# Patient Record
Sex: Male | Born: 1935 | Race: White | Hispanic: No | Marital: Married | Smoking: Never smoker
Health system: Southern US, Community
[De-identification: ages and names within clinical notes are randomized; demographics above are authoritative.]

## PROBLEM LIST (undated history)

## (undated) DIAGNOSIS — C801 Malignant (primary) neoplasm, unspecified: Secondary | ICD-10-CM

## (undated) DIAGNOSIS — R42 Dizziness and giddiness: Secondary | ICD-10-CM

## (undated) DIAGNOSIS — I10 Essential (primary) hypertension: Secondary | ICD-10-CM

## (undated) DIAGNOSIS — R609 Edema, unspecified: Secondary | ICD-10-CM

## (undated) DIAGNOSIS — K219 Gastro-esophageal reflux disease without esophagitis: Secondary | ICD-10-CM

## (undated) DIAGNOSIS — I82409 Acute embolism and thrombosis of unspecified deep veins of unspecified lower extremity: Secondary | ICD-10-CM

## (undated) HISTORY — PX: COLONOSCOPY: SHX174

## (undated) HISTORY — PX: OTHER SURGICAL HISTORY: SHX169

## (undated) HISTORY — PX: FRACTURE SURGERY: SHX138

## (undated) HISTORY — PX: BACK SURGERY: SHX140

---

## 1998-08-28 ENCOUNTER — Ambulatory Visit (HOSPITAL_COMMUNITY): Admission: RE | Admit: 1998-08-28 | Discharge: 1998-08-28 | Payer: Self-pay

## 2004-08-26 ENCOUNTER — Encounter: Admission: RE | Admit: 2004-08-26 | Discharge: 2004-08-26 | Payer: Self-pay | Admitting: Family Medicine

## 2004-09-02 ENCOUNTER — Encounter: Admission: RE | Admit: 2004-09-02 | Discharge: 2004-09-02 | Payer: Self-pay | Admitting: Family Medicine

## 2004-09-11 ENCOUNTER — Encounter: Admission: RE | Admit: 2004-09-11 | Discharge: 2004-09-11 | Payer: Self-pay | Admitting: Family Medicine

## 2007-08-01 ENCOUNTER — Encounter: Admission: RE | Admit: 2007-08-01 | Discharge: 2007-08-01 | Payer: Self-pay | Admitting: Neurosurgery

## 2007-08-10 ENCOUNTER — Inpatient Hospital Stay (HOSPITAL_COMMUNITY): Admission: RE | Admit: 2007-08-10 | Discharge: 2007-08-15 | Payer: Self-pay | Admitting: Neurosurgery

## 2007-09-07 ENCOUNTER — Encounter: Admission: RE | Admit: 2007-09-07 | Discharge: 2007-09-07 | Payer: Self-pay | Admitting: Neurosurgery

## 2007-09-23 ENCOUNTER — Emergency Department (HOSPITAL_COMMUNITY): Admission: EM | Admit: 2007-09-23 | Discharge: 2007-09-23 | Payer: Self-pay | Admitting: Emergency Medicine

## 2007-09-23 ENCOUNTER — Encounter: Admission: RE | Admit: 2007-09-23 | Discharge: 2007-09-23 | Payer: Self-pay | Admitting: Neurosurgery

## 2007-11-04 ENCOUNTER — Encounter: Admission: RE | Admit: 2007-11-04 | Discharge: 2007-11-04 | Payer: Self-pay | Admitting: Neurosurgery

## 2008-04-19 ENCOUNTER — Encounter: Admission: RE | Admit: 2008-04-19 | Discharge: 2008-04-19 | Payer: Self-pay | Admitting: Family Medicine

## 2008-05-11 ENCOUNTER — Encounter: Admission: RE | Admit: 2008-05-11 | Discharge: 2008-05-11 | Payer: Self-pay | Admitting: Neurosurgery

## 2008-05-23 ENCOUNTER — Inpatient Hospital Stay (HOSPITAL_COMMUNITY): Admission: RE | Admit: 2008-05-23 | Discharge: 2008-05-26 | Payer: Self-pay | Admitting: Neurosurgery

## 2008-06-20 ENCOUNTER — Encounter: Admission: RE | Admit: 2008-06-20 | Discharge: 2008-06-20 | Payer: Self-pay | Admitting: Neurosurgery

## 2008-08-24 ENCOUNTER — Encounter: Admission: RE | Admit: 2008-08-24 | Discharge: 2008-08-24 | Payer: Self-pay | Admitting: Neurosurgery

## 2008-10-08 IMAGING — CR DG LUMBAR SPINE 2-3V
3 series · 3 of 3 positions shown · non-contrast
Comparison: [REDACTED] lumbar spine radiographs 11/04/2007, lumbar spine
CT 05/11/2008, [REDACTED] intraoperative lumbar spine radiographs
05/23/2008.

CLINICAL DATA: PLIF.  Low back pain.

LUMBAR SPINE - 2-3 VIEW

[t l-spine a.p.]
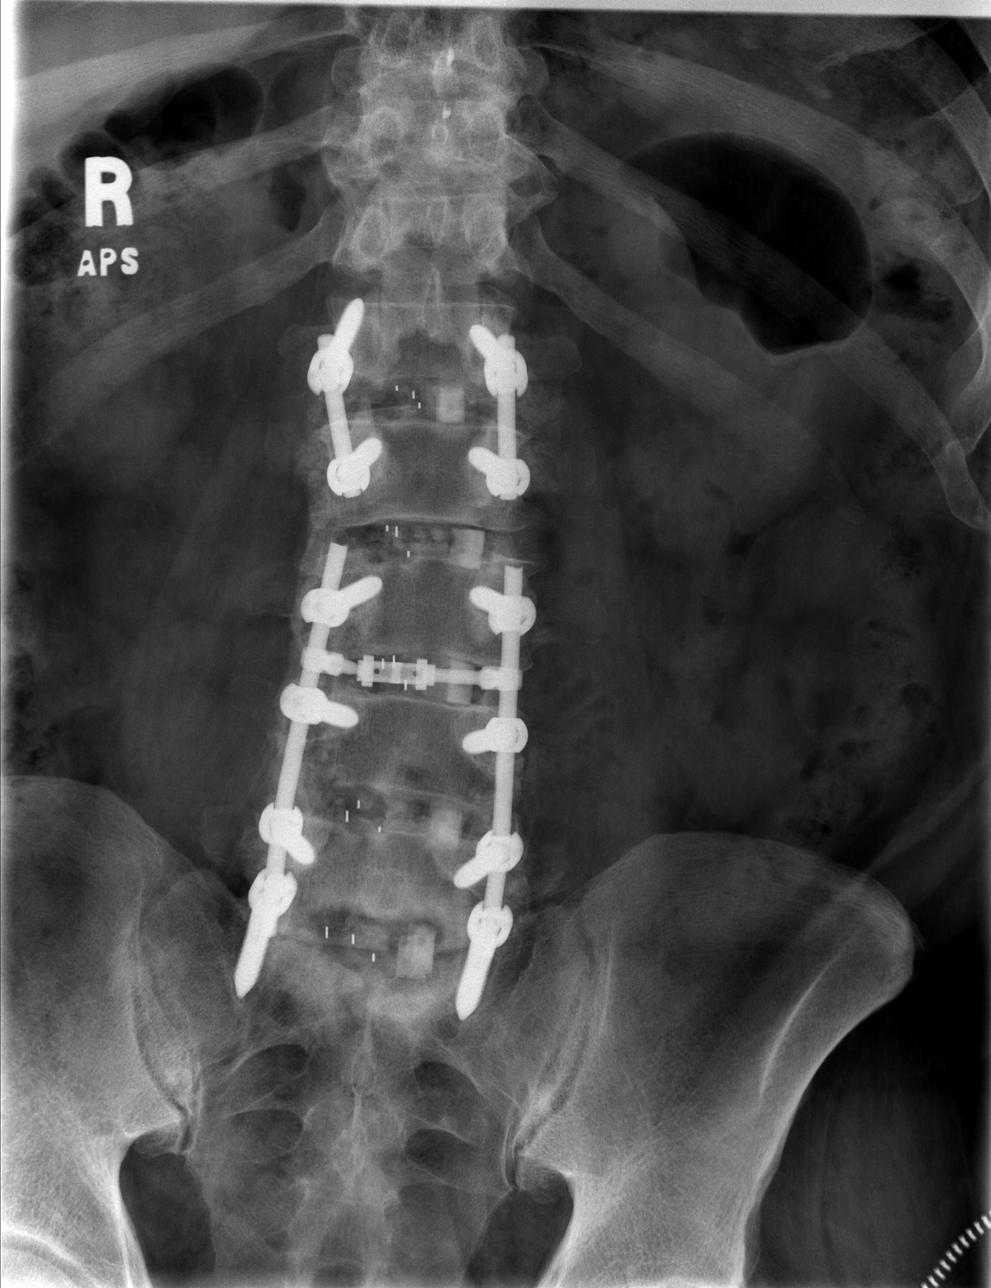

[t l-spine lat *]
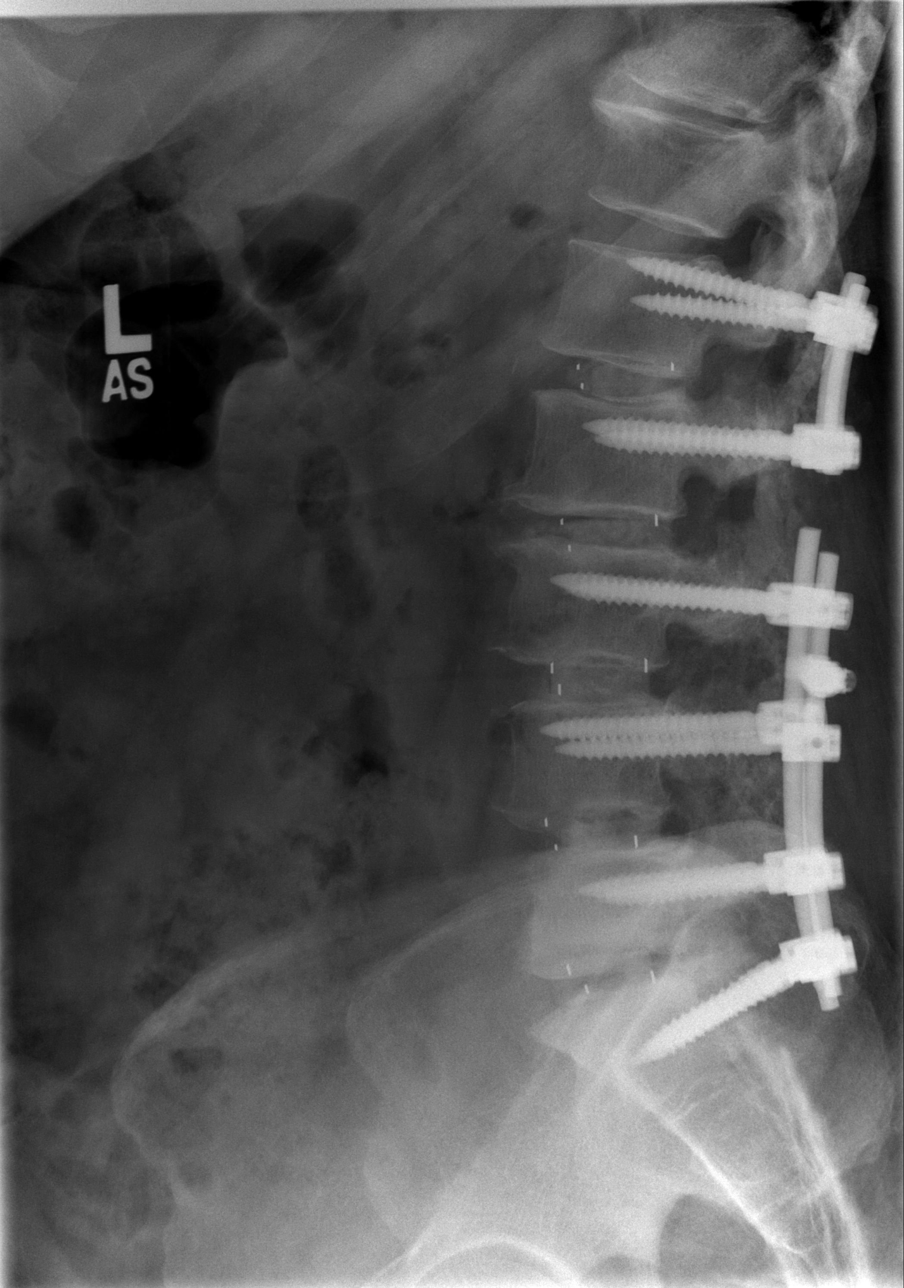

[t l-spine l5-s1 spot *]
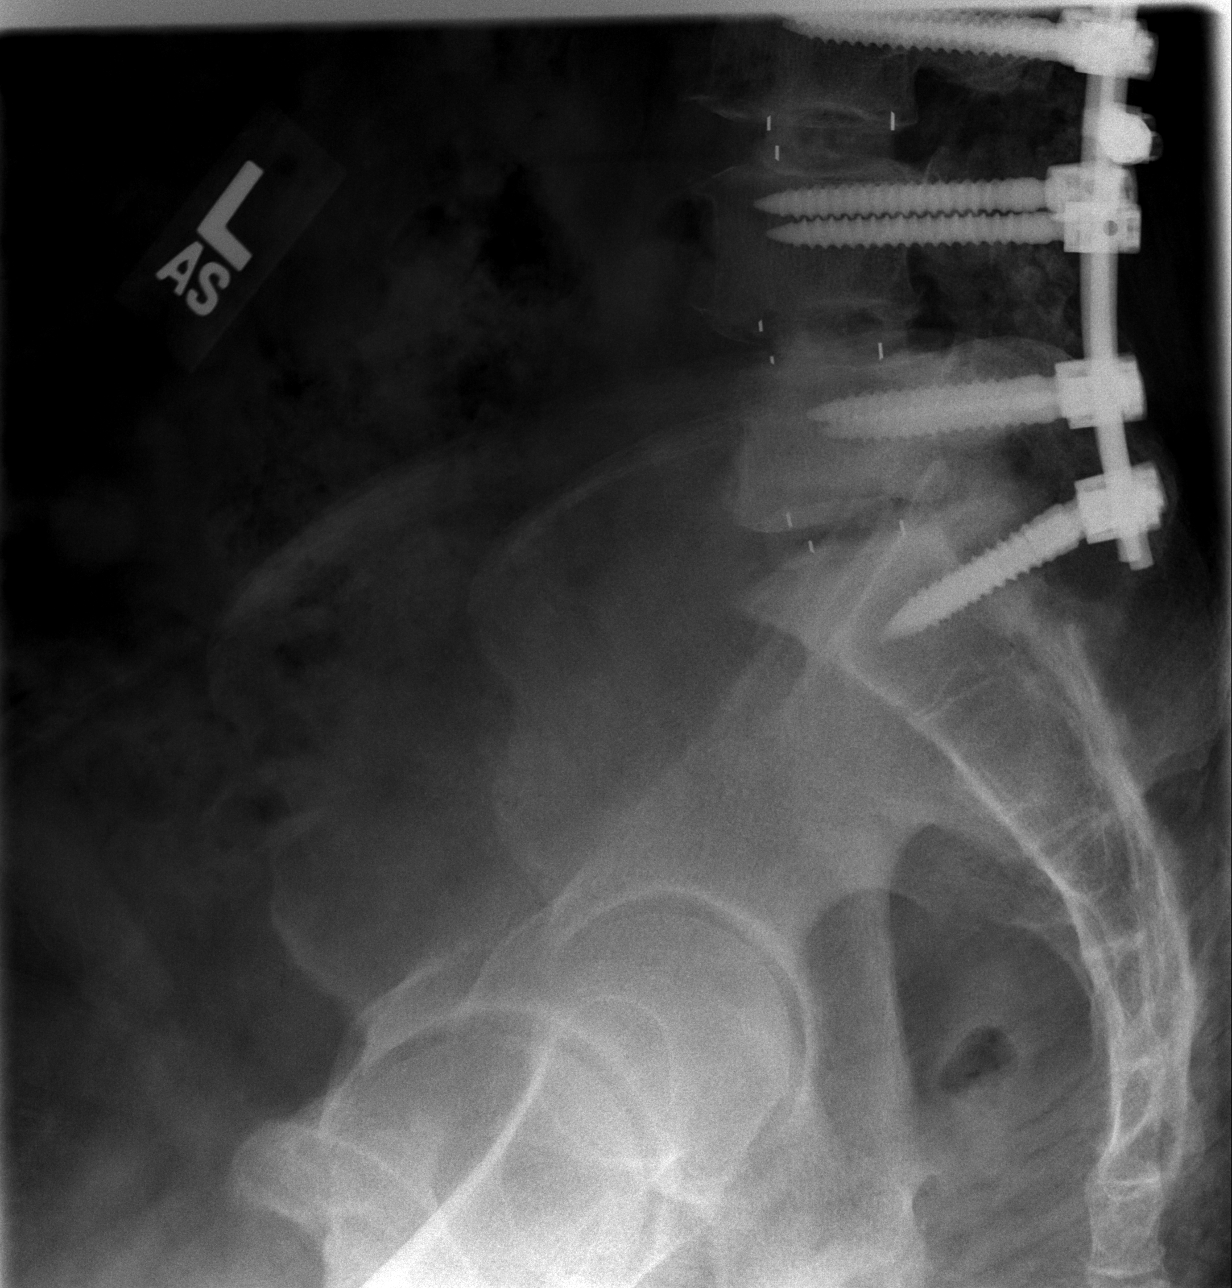

[3 of 3 positions shown; findings below may reference images not displayed]

FINDINGS: No change since 11/04/2007 and posterior laminectomy
fusion hardware and fusion of interbody bone plugs L2-S1.  Previous
[REDACTED] lumbar spine radiographs 11/04/2007 and lumbar spine CT
05/11/2008 better demonstrates lucencies suggesting loosening at
the tips of the S1 transpedicular screws.  No other new evidence
for resorption/loosening is seen at transpedicular screws.  No
change since 05/23/2008 with interval posterior laminectomy
transpedicular screws L1-2 with satisfactory placement L1-2 disc
interbody bone plug.  Stable minimal 3 mm anterolisthesis is seen
at L4-5 since 11/04/2007 with posterior vertebral alignment
normally maintained.  No new significant radiographic abnormalities
seen.
IMPRESSION: 1.  Interval posterior laminectomy fusion appears satisfactory
since 05/23/2008 at L1-2.
2.  Stable slight lucency of loosening bilateral S1 transpedicular
screws since 11/04/2007 and 05/11/2008 with otherwise stable
satisfactory appearing posterior laminectomy fusion changes L2-S1.
3.  Stable minimal 3 mm anterolisthesis L4-5 with otherwise normal
vertebral alignment.
4.  Otherwise, no new acute findings.

## 2011-01-28 NOTE — Op Note (Signed)
NAME:  Mitchell Jacobs, Mitchell Jacobs                 ACCOUNT NO.:  [id number]   MEDICAL RECORD NO.:  [id number]          PATIENT TYPE:  INP   LOCATION:  3172                         FACILITY:  [facility]   PHYSICIAN:  Sherilyn Cooter A. Pool, M.D.    DATE OF BIRTH:  Feb 05, 1936   DATE OF PROCEDURE:  DATE OF DISCHARGE:                               OPERATIVE REPORT   PREOPERATIVE DIAGNOSIS:  L2-3, L3-4, L4-5, L5-S1 degenerative disk  disease, stenosis and spondylolisthesis.   POSTOPERATIVE DIAGNOSIS:  L2-3, L3-4, L4-5, L5-S1 degenerative disk  disease, stenosis and spondylolisthesis.   PROCEDURE:  L4-5 redo laminectomy with bilateral L4 and L5 decompressive  foraminotomies, more that what would be required for simple interbody  fusion alone.  L2-3, L3-4 and L5-S1 decompressive laminectomy with  foraminotomies, more than what would be required for simple interbody  fusion alone.  L2-3, L3-4, L4-5 and L5-S1 posterior lumbar interbody  fusion __________ , interbody allograft wedge, total interbody PEEK cage  and local autografts.  L2 through S1 posterolateral arthrodesis  utilizing segmental pedicle screws in addition and local autograft.   SURGEON:  Kathaleen Maser. Pool, M.D.   ASSISTANT:  Reinaldo Meeker, M.D.   ANESTHESIA:  General endotracheal.   INDICATIONS:  Mr. Varelas is a 75 year old male with history of severe  pack and lower extremity pain failing all conservative measures.  Workup  demonstrates evidence of marked multilevel stenosis with evidence of  instability.  The patient is status post previous L4-5 laminotomy on the  right side.  The patient presents now for four-level decompression and  fusion and instrumentation and hopefully improvement of his symptoms.   OPERATIVE NOTE:  The patient brought to the operating room, placed in a  supine position.  After goal anesthesia achieved, the patient was turned  prone onto a Wilson frame.  Appropriate padding placed in lumbar  regions, prepped and draped  sterilely.  A 10 blade __________ August 11, 2007 skin incision overlying the L1, L2, L3, L4, L5, and S1 levels.  This was carried down sharply and then upper subperiosteal dissection  was performed.  Showed the lamina facet joints of L1, L2, L3, L4, L5 and  S1 as well as the transverse processes of L2 through S1.  Deep self-  retaining retractor was placed.  Intraoperative fluoroscopy was used.  Levels were confirmed.  Decompressive laminectomy was then performed  using sharp Kerrisons and high-speed drill through the entire lamina of  L2, entire lamina of L3, entire lamina of L4 and entire lamina of L5 and  superior aspect of lamina of S1.  The ligamentum  flavum was then elevated and resected in a piecemeal fashion.  Epidural  scar from previous laminotomy at L4-5 was also resected.  An inferior  facetectomies of L2, L3, L4, L5 and S1 were performed.   Dictation Ended At This Point.           ______________________________  Kathaleen Maser. Pool, M.D.     HAP/MEDQ  D:  08/10/2007  T:  08/11/2007  Job:  [id number]

## 2011-01-28 NOTE — Op Note (Signed)
NAME:  Mitchell Jacobs, Mitchell Jacobs                 ACCOUNT NO.:  [id number]   MEDICAL RECORD NO.:  [id number]          PATIENT TYPE:  INP   LOCATION:  3172                         FACILITY:  [facility]   PHYSICIAN:  Sherilyn Cooter A. Pool, M.D.    DATE OF BIRTH:  Jul 04, 1936   DATE OF PROCEDURE:  08/10/2007  DATE OF DISCHARGE:                               OPERATIVE REPORT   CONTINUATION:  After the laminectomies of L2, 3, 4, 5, and S1, inferior  facetectomies of L2, L3-L4 and L5 were performed bilaterally.  Superior  facetectomies of L3-L4, L5, and S1 were performed bilaterally.  Ligament  flavum and epidural scar were elevated and resected.  Epidural venous  plexus coagulated and cut starting first at the patient's L5-S1 level.  Thecal sac nerve was gently mobilized and tracked towards the midline.  Epidural venous plexus was coagulated and cut.  The disc space was then  incised with a 15 blade and retracted __________ .  Wide disc space  cleanout was then achieved using pituitary rongeurs __________  rongeurs, and Epstein curettes.  The procedure was then repeated on the  contralateral side at all other levels.  Disc space at L5-S1 was then  distracted up to 10 mm.  A 10 mm distractor was left in the patient's  right side.  Thecal sac nerve was retracted on the left side.  Disc  space was then reamed and cut with a 10 mm Tangent instrument.  Soft  tissues were removed from the interspace.  A 10 x 26 mm Tangent wedge  was then impacted in place and recessed approximately 2 mm from the  posterior __________  margin.  Distractors were removed from the  patient's contralateral side.  Disc space was further curettaged and  then once again reamed and cut with a 10 mm Tangent instrument.  Soft  tissues were removed from the interspace.  Morselized autograft mixed  with Progenics putty was then packed in the interspace.  A 10 x 26 mm  __________  cage was then impacted into place and recessed roughly 2 mm  from the  posterior __________  margin.  The procedure was then repeated  at L4-5, L3-4, and L2-3.  Pedicles of L2, 3, 4, 5, and S1 were then  identified using surface landmarks and intraoperative fluoroscopy.  Superficial bone around pedicle was then removed using a high-speed  drill.  Each pedicle was then probed using pedicle awl.  Pedicle awl  tract was then probed and found be solid in bone.  Each pedicle was then  tapped with a  5.25 mm screw.  Each screw tap hole was probed and found  to be solid in bone.  6.75 x 50 mm radius screws were placed bilaterally  at L2; 6.75 x 55 mm were placed bilaterally at L3 and L4; 6.75 x 45 mm  screws were placed bilaterally at L5; and 7.75 x 40 mm screws were  placed bilaterally at S1.  All screws were found be well positioned  mitral by intraoperative fluoroscopy.  Transverse processes and sacral  ala were then decorticated using  a high-speed drill.  Morselized  autograft was then packed posterolaterally for later fusion.  A segment  of titanium rod was then contoured, cut, and placed over the screw heads  from L2-S1.  Locking caps were placed over the screw heads.  The locking  caps then engaged with the construct under compression.  Final images  revealed good position of bone grafts  __________  spine.  A  transverse  connector was also placed.  Wound was then irrigated one final time.  Gelfoam was placed topically for hemostasis, which was found to be good.  A medium Hemovac drain was left __________  .  The wound was then closed  in layers with Vicryl suture.  Steri-Strips and a sterile dressing were  applied.  There were no complications.  The patient tolerated the  procedure well, and he returns to the recovery room for postoperative  care.           ______________________________  Kathaleen Maser. Pool, M.D.     HAP/MEDQ  D:  08/10/2007  T:  08/11/2007  Job:  [id number]

## 2011-01-28 NOTE — Op Note (Signed)
NAME:  Mitchell Jacobs, Mitchell Jacobs                 ACCOUNT NO.:  [id number]   MEDICAL RECORD NO.:  [id number]          PATIENT TYPE:  INP   LOCATION:  3001                         FACILITY:  [facility]   PHYSICIAN:  Sherilyn Cooter A. Pool, M.D.    DATE OF BIRTH:  1935-12-12   DATE OF PROCEDURE:  DATE OF DISCHARGE:  08/15/2007                               OPERATIVE REPORT   SERVICE:  Neurosurgery.   FINAL DIAGNOSIS:  L2-3, L3-4, L4-5 and L5-S1 degenerative disease with  stenosis.   OPERATION AND TREATMENT:  L2-3, L3-4, L4-5, L5-S1 decompression and  fusion with instrumentation.   HISTORY OF PRESENT ILLNESS:  [name] is a 75 year old male with  history of severe back and bilateral lower extremity pain secondary to  marked multilevel stenosis.  The patient has failed all conservative  management and presented now for multilevel decompression and fusion.   HOSPITAL COURSE:  Patient taken to the operating room where an  uncomplicated 4-level decompression and fusion with instrumentation was  performed.  Postoperatively the patient did well.  Back and lower  extremity pain were much improved.  He was gradually mobilized with the  aid of physical and occupational therapy.  Throughout the patient's  hospital course he has had no lower extremity pain.  His back pain has  been well managed.  His wound has been healing well.  He was ready for  discharge home with the assistance of home physical therapy.   CONDITION AT DISCHARGE:  Improved.   DISCHARGE/DISPOSITION:  The patient is to be discharged home.  He will  follow up in my office in 1 week.           ______________________________  Kathaleen Maser. Pool, M.D.     HAP/MEDQ  D:  09/07/2007  T:  09/07/2007  Job:  [id number]

## 2011-01-28 NOTE — Op Note (Signed)
NAME:  Mitchell Jacobs, Mitchell Jacobs NO.:  [id number]   MEDICAL RECORD NO.:  [id number]          PATIENT TYPE:  INP   LOCATION:  3014                         FACILITY:  [facility]   PHYSICIAN:  Sherilyn Cooter A. Pool, M.D.    DATE OF BIRTH:  [birth year]   DATE OF PROCEDURE:  05/23/2008  DATE OF DISCHARGE:                               OPERATIVE REPORT   PREOPERATIVE DIAGNOSES:  L1-L2 degenerative disease with herniated  pulposus and severe stenosis.  Status post L2-S1 posterolateral  arthrodesis with instrumentation.   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  Status post L2-S1 posterolateral  arthrodesis with instrumentation.   PROCEDURE NAME:  Re-exploration of L2-S1 posterior fusion with removal  of instrumentation.  L1-L2 posterior lumbar body fusion utilizing  tangent interbody allograft wedge, Telamon interbody PEEK cage, and  local autografting.   SURGEON:  Kathaleen Maser. Pool, MD   ASSISTANT:  Reinaldo Meeker, MD   ANESTHESIA:  General endotracheal.   INDICATIONS:  Mitchell Jacobs is a 75 year old male who is status post  previous L2-S1 decompression, fusion, and instrumentation with good  results.  The patient presents now with bilateral lower extremity  weakness.  Workup demonstrates evidence of severe stenosis of the L1-L2  level.  We discussed the options of management including possibly moving  forward with operative decompression and fusion.  The patient is aware  of the risks and benefits and wished to proceed.   OPERATION NOTE:  The patient was brought to the operating room and  placed on the operating table in supine position.  After an adequate  level of anesthesia was achieved, the patient was positioned prone onto  Wilson frame.  Appropriately padded the patient's lumbar region, prepped  and draped sterilely.  A 10 blade was used to make a curvilinear skin  incision, overlying the T12, L1, L2, and L3 levels.  This was  carried  down sharply in the midline.  Subperiosteal dissection was then  performed exposing the lamina and facet joints of T12 and L1 as well as  the previous posterolateral fusion and instrumentation at L2-L3.  Pedicle screws at L2 were disassembled.  The titanium rod was then cut  between L1-L2.  Short segmental rod was removed.  Pedicle screws were  explored and found to be solid.  The fusion at L2-L3 was quite solid.  It was not felt necessary to apply any type of connector and we could  just leave this as a fused floating segment at L2-L3.  Attention was  then placed to L1-L2.  Decompressive laminectomy and facetectomy were  then performed using Leksell rongeurs, Kerrison rongeurs, and high-speed  drill to remove the entire lamina of L1, entire inferior facet of L1  bilaterally, superior facet of L2 bilaterally, and some residual lamina  of L2.  Ligament flavum and epidural scar were elevated and resected in  a piecemeal fashion using Kerrison rongeurs.  Underlying thecal sac and  exiting L1-L2 nerve roots were identified.  Wide decompressive  foraminotomies were then performed along course exiting nerve roots at  L1-L2.  Epidural venous plexus was coagulated and cut.  Starting first  on the patient's left side, thecal sac and nerve roots were gently  mobilized and tracked towards midline.  Disk space was incised with a 15  blade in a rectangular fashion.  Wide space clean-out was achieved using  pituitary rongeurs, upbiting pituitary rongeurs, and Epstein curettes.  Procedure was then repeated on the contralateral side.  Disk space was  then sequentially dilated to 8 mm with a 8-mm distractor left on the  patient's left side.  Disk space was then prepared using 8-mm tangent  chisel.  An 8-mm Telamon cage, packed with morselized autografts and  placed into the right side and recessed approximately 2 mm from the  posterior critical margin.  Disk retractor was then placed on  the   patient's left side.  Tangent instruments were once again used to place  an 8 x 26 mm tangent cage on the left side.  Prior to instillation of  the tangent wedge, morselized autograft was then packed interspace.  Pedicles at L1 were then identified using surface landmarks and  intraoperative fluoroscopy.  Superficial bone over the pedicle was then  removed using high-speed drill.  Each pedicles were then probed using  pedicle awl.  The initial awl track was then probed and found to be  solid bone.  Each pedicle awl track was then tapped with 5.25-mm screw  tapper.  Each screw tap hole was probed and found to be solid bone.  The  6.75 x 45 mm radius screws were placed bilaterally at L1.  Short-segment  titanium rod was then placed through the screw heads at L1-L2.  Morselized autograft was packed posterolaterally for later fusion.  Locking caps were placed over the screw head.  The locking caps were  then engaged and constructed under compression.  Final images revealed  good position of bone grafts, hardware with proper operative level, and  normal alignment of spine. Wound was then irrigated one final time.  It  was then closed in typical fashion.  Steri-Strips and sterile dressing  were applied.  There were no complications.  The patient tolerated the  procedure well and he returned to the recovery room for postoperative  care.           ______________________________  Kathaleen Maser. Pool, M.D.     HAP/MEDQ  D:  05/23/2008  T:  05/24/2008  Job:  [id number]

## 2011-06-18 LAB — CBC
HCT: 43.7
Hemoglobin: 14.6
MCHC: 33.4
MCV: 93.3
Platelets: 271
RBC: 4.68
RDW: 14.3

## 2011-06-18 LAB — APTT: aPTT: 29

## 2011-06-18 LAB — BASIC METABOLIC PANEL
Calcium: 9.6
Creatinine, Ser: 0.84
Sodium: 139

## 2011-06-18 LAB — TYPE AND SCREEN: ABO/RH(D): O POS

## 2011-06-18 LAB — PROTIME-INR
INR: 1.1
Prothrombin Time: 14.3

## 2011-06-18 LAB — DIFFERENTIAL
Basophils Relative: 0
Neutrophils Relative %: 54

## 2011-06-24 LAB — CBC
HCT: 32.5 — ABNORMAL LOW
HCT: 35.8 — ABNORMAL LOW
Hemoglobin: 10.8 — ABNORMAL LOW
Hemoglobin: 12.3 — ABNORMAL LOW
Hemoglobin: 14.6
MCHC: 33.6
MCV: 94.9
Platelets: 208
RBC: 3.82 — ABNORMAL LOW
RBC: 4.65
RDW: 14.6
WBC: 14.7 — ABNORMAL HIGH

## 2011-06-24 LAB — POCT I-STAT EG7
Bicarbonate: 26.5 — ABNORMAL HIGH
Calcium, Ion: 1.22
O2 Saturation: 100
TCO2: 28
pCO2, Ven: 49
pO2, Ven: 214 — ABNORMAL HIGH

## 2011-06-24 LAB — DIFFERENTIAL
Basophils Relative: 1
Eosinophils Relative: 0
Lymphocytes Relative: 9 — ABNORMAL LOW
Lymphs Abs: 1.4
Lymphs Abs: 3.1
Monocytes Absolute: 0.3
Monocytes Absolute: 0.6
Monocytes Relative: 2 — ABNORMAL LOW
Monocytes Relative: 6
Neutro Abs: 12.9 — ABNORMAL HIGH
Neutro Abs: 6.9

## 2011-06-24 LAB — BASIC METABOLIC PANEL
CO2: 31
Calcium: 9.3
Chloride: 99
GFR calc Af Amer: >60
Sodium: 138

## 2018-06-04 ENCOUNTER — Ambulatory Visit: Payer: Self-pay | Admitting: Family Medicine

## 2018-07-13 ENCOUNTER — Encounter: Payer: Self-pay | Admitting: *Deleted

## 2018-07-20 ENCOUNTER — Other Ambulatory Visit: Payer: Self-pay

## 2018-07-20 ENCOUNTER — Ambulatory Visit
Admission: RE | Admit: 2018-07-20 | Discharge: 2018-07-20 | Disposition: A | Discharge status: Home / Self Care | Payer: Medicare Other | Source: Ambulatory Visit | Attending: Ophthalmology | Admitting: Ophthalmology

## 2018-07-20 ENCOUNTER — Encounter
Admission: RE | Disposition: A | Discharge status: Home / Self Care | Payer: Self-pay | Source: Ambulatory Visit | Attending: Ophthalmology

## 2018-07-20 ENCOUNTER — Encounter: Payer: Self-pay | Admitting: *Deleted

## 2018-07-20 ENCOUNTER — Ambulatory Visit: Payer: Medicare Other | Admitting: Anesthesiology

## 2018-07-20 DIAGNOSIS — I10 Essential (primary) hypertension: Secondary | ICD-10-CM | POA: Diagnosis not present

## 2018-07-20 DIAGNOSIS — Z86718 Personal history of other venous thrombosis and embolism: Secondary | ICD-10-CM | POA: Diagnosis not present

## 2018-07-20 DIAGNOSIS — H2511 Age-related nuclear cataract, right eye: Secondary | ICD-10-CM | POA: Insufficient documentation

## 2018-07-20 DIAGNOSIS — Z85828 Personal history of other malignant neoplasm of skin: Secondary | ICD-10-CM | POA: Diagnosis not present

## 2018-07-20 HISTORY — DX: Edema, unspecified: R60.9

## 2018-07-20 HISTORY — PX: CATARACT EXTRACTION W/PHACO: SHX586

## 2018-07-20 HISTORY — DX: Dizziness and giddiness: R42

## 2018-07-20 HISTORY — DX: Gastro-esophageal reflux disease without esophagitis: K21.9

## 2018-07-20 HISTORY — DX: Acute embolism and thrombosis of unspecified deep veins of unspecified lower extremity: I82.409

## 2018-07-20 HISTORY — DX: Essential (primary) hypertension: I10

## 2018-07-20 HISTORY — DX: Malignant (primary) neoplasm, unspecified: C80.1

## 2018-07-20 SURGERY — PHACOEMULSIFICATION, CATARACT, WITH IOL INSERTION
Anesthesia: Monitor Anesthesia Care | Site: Eye | Laterality: Right

## 2018-07-20 MED ORDER — MOXIFLOXACIN HCL 0.5 % OP SOLN
OPHTHALMIC | Status: DC | PRN
  Administered 2018-07-20: .2 mL via OPHTHALMIC

## 2018-07-20 MED ORDER — TETRACAINE HCL 0.5 % OP SOLN
1.0000 [drp] | OPHTHALMIC | Status: AC | PRN
  Administered 2018-07-20 (×3): 1 [drp] via OPHTHALMIC

## 2018-07-20 MED ORDER — SODIUM CHLORIDE 0.9 % IV SOLN
INTRAVENOUS | Status: DC
  Administered 2018-07-20: 07:00:00 via INTRAVENOUS

## 2018-07-20 MED ORDER — ARMC OPHTHALMIC DILATING DROPS
1.0000 "application " | OPHTHALMIC | Status: AC
  Administered 2018-07-20 (×3): 1 via OPHTHALMIC

## 2018-07-20 MED ORDER — LIDOCAINE HCL (PF) 4 % IJ SOLN
INTRAOCULAR | Status: DC | PRN
  Administered 2018-07-20: 2 mL via OPHTHALMIC

## 2018-07-20 MED ORDER — LIDOCAINE HCL (PF) 4 % IJ SOLN
INTRAMUSCULAR | Status: AC
  Filled 2018-07-20: qty 5

## 2018-07-20 MED ORDER — CARBACHOL 0.01 % IO SOLN
INTRAOCULAR | Status: DC | PRN
  Administered 2018-07-20: .5 mL via INTRAOCULAR

## 2018-07-20 MED ORDER — FENTANYL CITRATE (PF) 100 MCG/2ML IJ SOLN
INTRAMUSCULAR | Status: DC | PRN
  Administered 2018-07-20: 50 ug via INTRAVENOUS
  Administered 2018-07-20 (×2): 25 ug via INTRAVENOUS

## 2018-07-20 MED ORDER — EPINEPHRINE PF 1 MG/ML IJ SOLN
INTRAMUSCULAR | Status: AC
  Filled 2018-07-20: qty 1

## 2018-07-20 MED ORDER — NA CHONDROIT SULF-NA HYALURON 40-17 MG/ML IO SOLN
INTRAOCULAR | Status: AC
  Filled 2018-07-20: qty 1

## 2018-07-20 MED ORDER — NA CHONDROIT SULF-NA HYALURON 40-17 MG/ML IO SOLN
INTRAOCULAR | Status: DC | PRN
  Administered 2018-07-20: 1 mL via INTRAOCULAR

## 2018-07-20 MED ORDER — FENTANYL CITRATE (PF) 100 MCG/2ML IJ SOLN
INTRAMUSCULAR | Status: AC
  Filled 2018-07-20: qty 2

## 2018-07-20 MED ORDER — TETRACAINE HCL 0.5 % OP SOLN
OPHTHALMIC | Status: AC
  Administered 2018-07-20: 1 [drp] via OPHTHALMIC
  Filled 2018-07-20: qty 4

## 2018-07-20 MED ORDER — POVIDONE-IODINE 5 % OP SOLN
OPHTHALMIC | Status: DC | PRN
  Administered 2018-07-20: 1 via OPHTHALMIC

## 2018-07-20 MED ORDER — POVIDONE-IODINE 5 % OP SOLN
OPHTHALMIC | Status: AC
  Filled 2018-07-20: qty 30

## 2018-07-20 MED ORDER — MOXIFLOXACIN HCL 0.5 % OP SOLN: 1.0000 [drp] | OPHTHALMIC | Status: DC | PRN

## 2018-07-20 MED ORDER — ARMC OPHTHALMIC DILATING DROPS
OPHTHALMIC | Status: AC
  Administered 2018-07-20: 1 via OPHTHALMIC
  Filled 2018-07-20: qty 0.5

## 2018-07-20 MED ORDER — EPINEPHRINE PF 1 MG/ML IJ SOLN
INTRAOCULAR | Status: DC | PRN
  Administered 2018-07-20: 1 mL via OPHTHALMIC

## 2018-07-20 MED ORDER — MOXIFLOXACIN HCL 0.5 % OP SOLN
OPHTHALMIC | Status: AC
  Filled 2018-07-20: qty 3

## 2018-07-20 SURGICAL SUPPLY — 16 items
GLOVE BIO SURGEON STRL SZ8 (GLOVE) ×3 IMPLANT
GLOVE BIOGEL M 6.5 STRL (GLOVE) ×3 IMPLANT
GLOVE SURG LX 8.0 MICRO (GLOVE) ×2
GLOVE SURG LX STRL 8.0 MICRO (GLOVE) ×1 IMPLANT
GOWN STRL REUS W/ TWL LRG LVL3 (GOWN DISPOSABLE) ×2 IMPLANT
GOWN STRL REUS W/TWL LRG LVL3 (GOWN DISPOSABLE) ×6
LABEL CATARACT MEDS ST (LABEL) ×3 IMPLANT
LENS IOL TECNIS ITEC 21.0 (Intraocular Lens) ×2 IMPLANT
PACK CATARACT (MISCELLANEOUS) ×3 IMPLANT
PACK CATARACT BRASINGTON LX (MISCELLANEOUS) ×3 IMPLANT
PACK EYE AFTER SURG (MISCELLANEOUS) ×3 IMPLANT
SOL BSS BAG (MISCELLANEOUS) ×3
SOLUTION BSS BAG (MISCELLANEOUS) ×1 IMPLANT
SYR 5ML LL (SYRINGE) ×3 IMPLANT
WATER STERILE IRR 250ML POUR (IV SOLUTION) ×3 IMPLANT
WIPE NON LINTING 3.25X3.25 (MISCELLANEOUS) ×3 IMPLANT

## 2018-07-20 NOTE — Anesthesia Postprocedure Evaluation (Signed)
Anesthesia Post Note  Patient: Mitchell Jacobs  Procedure(s) Performed: CATARACT EXTRACTION PHACO AND INTRAOCULAR LENS PLACEMENT (IOC) (Right Eye)  Patient location during evaluation: PACU Anesthesia Type: MAC Level of consciousness: awake and alert and oriented Pain management: pain level controlled Vital Signs Assessment: post-procedure vital signs reviewed and stable Respiratory status: respiratory function stable Cardiovascular status: stable Anesthetic complications: no     Last Vitals:  Vitals:   07/20/18 0656 07/20/18 0822  BP: (!) 149/63   Pulse: 62 (!) 58  Resp: 12 14  Temp: 36.5 C (!) 36.2 C  SpO2: 99% 100%    Last Pain:  Vitals:   07/20/18 0822  TempSrc: Temporal  PainSc: 0-No pain                 Blima Singer

## 2018-07-20 NOTE — Anesthesia Preprocedure Evaluation (Signed)
Anesthesia Evaluation  Patient identified by MRN, date of birth, ID band Patient awake    Reviewed: Allergy & Precautions, NPO status , Patient's Chart, lab work & pertinent test results  History of Anesthesia Complications Negative for: history of anesthetic complications  Airway Mallampati: II  TM Distance: >3 FB Neck ROM: Full    Dental  (+) Lower Dentures   Pulmonary neg pulmonary ROS, neg sleep apnea, neg COPD,    breath sounds clear to auscultation- rhonchi (-) wheezing      Cardiovascular hypertension, Pt. on medications + DVT  (-) CAD, (-) Past MI, (-) Cardiac Stents and (-) CABG  Rhythm:Regular Rate:Normal - Systolic murmurs and - Diastolic murmurs    Neuro/Psych negative neurological ROS  negative psych ROS   GI/Hepatic Neg liver ROS, GERD  ,  Endo/Other  negative endocrine ROSneg diabetes  Renal/GU negative Renal ROS     Musculoskeletal negative musculoskeletal ROS (+)   Abdominal (+) - obese,   Peds  Hematology negative hematology ROS (+)   Anesthesia Other Findings Past Medical History: No date: Cancer ([place])     Comment:  SKIN No date: DVT, lower extremity ([facility])     Comment:  H/O AFTER BACK SURGERY No date: Edema     Comment:  FEET/ANKLES No date: GERD (gastroesophageal reflux disease) No date: Hypertension No date: Vertigo   Reproductive/Obstetrics                             Anesthesia Physical Anesthesia Plan  ASA: II  Anesthesia Plan: MAC   Post-op Pain Management:    Induction: Intravenous  PONV Risk Score and Plan: 1 and Midazolam  Airway Management Planned: Natural Airway  Additional Equipment:   Intra-op Plan:   Post-operative Plan:   Informed Consent: I have reviewed the patients History and Physical, chart, labs and discussed the procedure including the risks, benefits and alternatives for the proposed anesthesia with the patient or authorized  representative who has indicated his/her understanding and acceptance.     Plan Discussed with: CRNA and Anesthesiologist  Anesthesia Plan Comments:         Anesthesia Quick Evaluation

## 2018-07-20 NOTE — Anesthesia Post-op Follow-up Note (Signed)
Anesthesia QCDR form completed.        

## 2018-07-20 NOTE — H&P (Signed)
All labs reviewed. Abnormal studies sent to patients PCP when indicated.  Previous H&P reviewed, patient examined, there are NO CHANGES.  Mitchell Ellenwood Porfilio11/5/20197:56 AM

## 2018-07-20 NOTE — Op Note (Signed)
PREOPERATIVE DIAGNOSIS:  Nuclear sclerotic cataract of the right eye.   POSTOPERATIVE DIAGNOSIS:  nuclear sclerotic cataract right eye   OPERATIVE PROCEDURE: Procedure(s): CATARACT EXTRACTION PHACO AND INTRAOCULAR LENS PLACEMENT (IOC)   SURGEON:  Birder Robson, MD.   ANESTHESIA:  Anesthesiologist: Emmie Niemann, MD CRNA: Demetrius Charity, CRNA; Jonna Clark, CRNA  1.      Managed anesthesia care. 2.      0.11ml of Shugarcaine was instilled in the eye following the paracentesis.   COMPLICATIONS:  None.   TECHNIQUE:   Stop and chop   DESCRIPTION OF PROCEDURE:  The patient was examined and consented in the preoperative holding area where the aforementioned topical anesthesia was applied to the right eye and then brought back to the Operating Room where the right eye was prepped and draped in the usual sterile ophthalmic fashion and a lid speculum was placed. A paracentesis was created with the side port blade and the anterior chamber was filled with viscoelastic. A near clear corneal incision was performed with the steel keratome. A continuous curvilinear capsulorrhexis was performed with a cystotome followed by the capsulorrhexis forceps. Hydrodissection and hydrodelineation were carried out with BSS on a blunt cannula. The lens was removed in a stop and chop  technique and the remaining cortical material was removed with the irrigation-aspiration handpiece. The capsular bag was inflated with viscoelastic and the Technis ZCB00  lens was placed in the capsular bag without complication. The remaining viscoelastic was removed from the eye with the irrigation-aspiration handpiece. The wounds were hydrated. The anterior chamber was flushed with Miostat and the eye was inflated to physiologic pressure. 0.53ml of Vigamox was placed in the anterior chamber. The wounds were found to be water tight. The eye was dressed with Vigamox. The patient was given protective glasses to wear throughout the day  and a shield with which to sleep tonight. The patient was also given drops with which to begin a drop regimen today and will follow-up with me in one day. Implant Name Type Inv. Item Serial No. Manufacturer Lot No. LRB No. Used  LENS IOL DIOP 21.0 - [id number] Intraocular Lens LENS IOL DIOP 21.0 [id number] AMO  Right 1   Procedure(s) with comments: CATARACT EXTRACTION PHACO AND INTRAOCULAR LENS PLACEMENT (IOC) (Right) - [place]  00:52 CDE 7.35 FLUID PACK LOT # [id number]  Electronically signed: Birder Robson 07/20/2018 8:19 AM

## 2018-07-20 NOTE — Discharge Instructions (Addendum)
Eye Surgery Discharge Instructions  Expect mild scratchy sensation or mild soreness. DO NOT RUB YOUR EYE!  The day of surgery:  Minimal physical activity, but bed rest is not required  No reading, computer work, or close hand work  No bending, lifting, or straining.  May watch TV  For 24 hours:  No driving, legal decisions, or alcoholic beverages  Safety precautions  Eat anything you prefer: It is better to start with liquids, then soup then solid foods.  Solar shield eyeglasses should be worn for comfort in the sunlight/patch while sleeping  Resume all regular medications including aspirin or Coumadin if these were discontinued prior to surgery. You may shower, bathe, shave, or wash your hair. Tylenol may be taken for mild discomfort. FOLLOW DR. PORFILIO'S EYE DROP INSTRUCTION SHEET AS REVIEWED.  Call your doctor if you experience significant pain, nausea, or vomiting, fever > 101 or other signs of infection. [phone] or [phone] Specific instructions:  Follow-up Information    Birder Robson, MD Follow up.   Specialty:  Ophthalmology Why:  07/21/18 @ 9:00 am Contact information: [address] [address] [phone]

## 2018-07-20 NOTE — Transfer of Care (Signed)
Immediate Anesthesia Transfer of Care Note  Patient: Phillis Knack  Procedure(s) Performed: CATARACT EXTRACTION PHACO AND INTRAOCULAR LENS PLACEMENT (IOC) (Right Eye)  Patient Location: PACU  Anesthesia Type:MAC  Level of Consciousness: awake, alert  and oriented  Airway & Oxygen Therapy: Patient Spontanous Breathing  Post-op Assessment: Report given to RN and Post -op Vital signs reviewed and stable  Post vital signs: Reviewed and stable  Last Vitals:  Vitals Value Taken Time  BP 108/62   Temp    Pulse 55   Resp 16   SpO2 100     Last Pain:  Vitals:   07/20/18 0656  TempSrc: Oral  PainSc: 0-No pain         Complications: No apparent anesthesia complications

## 2018-08-10 ENCOUNTER — Ambulatory Visit: Payer: Self-pay | Admitting: Family Medicine

## 2018-09-14 ENCOUNTER — Encounter: Payer: Self-pay | Admitting: *Deleted

## 2018-09-21 ENCOUNTER — Ambulatory Visit: Payer: Medicare Other | Admitting: Anesthesiology

## 2018-09-21 ENCOUNTER — Encounter
Admission: RE | Disposition: A | Discharge status: Home / Self Care | Payer: Self-pay | Source: Home / Self Care | Attending: Ophthalmology

## 2018-09-21 ENCOUNTER — Ambulatory Visit
Admission: RE | Admit: 2018-09-21 | Discharge: 2018-09-21 | Disposition: A | Discharge status: Home / Self Care | Payer: Medicare Other | Attending: Ophthalmology | Admitting: Ophthalmology

## 2018-09-21 ENCOUNTER — Encounter: Payer: Self-pay | Admitting: *Deleted

## 2018-09-21 ENCOUNTER — Other Ambulatory Visit: Payer: Self-pay

## 2018-09-21 DIAGNOSIS — Z7901 Long term (current) use of anticoagulants: Secondary | ICD-10-CM | POA: Insufficient documentation

## 2018-09-21 DIAGNOSIS — I10 Essential (primary) hypertension: Secondary | ICD-10-CM | POA: Diagnosis not present

## 2018-09-21 DIAGNOSIS — H2512 Age-related nuclear cataract, left eye: Secondary | ICD-10-CM | POA: Insufficient documentation

## 2018-09-21 DIAGNOSIS — Z79899 Other long term (current) drug therapy: Secondary | ICD-10-CM | POA: Diagnosis not present

## 2018-09-21 DIAGNOSIS — K219 Gastro-esophageal reflux disease without esophagitis: Secondary | ICD-10-CM | POA: Diagnosis not present

## 2018-09-21 DIAGNOSIS — Z85828 Personal history of other malignant neoplasm of skin: Secondary | ICD-10-CM | POA: Insufficient documentation

## 2018-09-21 DIAGNOSIS — Z86718 Personal history of other venous thrombosis and embolism: Secondary | ICD-10-CM | POA: Diagnosis not present

## 2018-09-21 HISTORY — PX: CATARACT EXTRACTION W/PHACO: SHX586

## 2018-09-21 SURGERY — PHACOEMULSIFICATION, CATARACT, WITH IOL INSERTION
Anesthesia: Monitor Anesthesia Care | Site: Eye | Laterality: Left

## 2018-09-21 MED ORDER — NA CHONDROIT SULF-NA HYALURON 40-17 MG/ML IO SOLN
INTRAOCULAR | Status: DC | PRN
  Administered 2018-09-21: 1 mL via INTRAOCULAR

## 2018-09-21 MED ORDER — FENTANYL CITRATE (PF) 100 MCG/2ML IJ SOLN
INTRAMUSCULAR | Status: DC | PRN
  Administered 2018-09-21: 50 ug via INTRAVENOUS

## 2018-09-21 MED ORDER — LIDOCAINE HCL (PF) 4 % IJ SOLN
INTRAOCULAR | Status: DC | PRN
  Administered 2018-09-21: 4 mL via OPHTHALMIC

## 2018-09-21 MED ORDER — TETRACAINE HCL 0.5 % OP SOLN
OPHTHALMIC | Status: AC
  Filled 2018-09-21: qty 4

## 2018-09-21 MED ORDER — POVIDONE-IODINE 5 % OP SOLN
OPHTHALMIC | Status: DC | PRN
  Administered 2018-09-21: 1 via OPHTHALMIC

## 2018-09-21 MED ORDER — SODIUM CHLORIDE 0.9 % IV SOLN
INTRAVENOUS | Status: DC
  Administered 2018-09-21: 09:00:00 via INTRAVENOUS

## 2018-09-21 MED ORDER — MOXIFLOXACIN HCL 0.5 % OP SOLN
OPHTHALMIC | Status: DC | PRN
  Administered 2018-09-21: 0.2 mL via OPHTHALMIC

## 2018-09-21 MED ORDER — MOXIFLOXACIN HCL 0.5 % OP SOLN
OPHTHALMIC | Status: AC
  Filled 2018-09-21: qty 3

## 2018-09-21 MED ORDER — MOXIFLOXACIN HCL 0.5 % OP SOLN: 1.0000 [drp] | OPHTHALMIC | Status: DC | PRN

## 2018-09-21 MED ORDER — ARMC OPHTHALMIC DILATING DROPS
OPHTHALMIC | Status: AC
  Filled 2018-09-21: qty 0.5

## 2018-09-21 MED ORDER — EPINEPHRINE PF 1 MG/ML IJ SOLN
INTRAMUSCULAR | Status: AC
  Filled 2018-09-21: qty 2

## 2018-09-21 MED ORDER — POVIDONE-IODINE 5 % OP SOLN
OPHTHALMIC | Status: AC
  Filled 2018-09-21: qty 30

## 2018-09-21 MED ORDER — EPINEPHRINE PF 1 MG/ML IJ SOLN
INTRAOCULAR | Status: DC | PRN
  Administered 2018-09-21: 10:00:00 via OPHTHALMIC

## 2018-09-21 MED ORDER — NA CHONDROIT SULF-NA HYALURON 40-17 MG/ML IO SOLN
INTRAOCULAR | Status: AC
  Filled 2018-09-21: qty 1

## 2018-09-21 MED ORDER — TETRACAINE HCL 0.5 % OP SOLN
1.0000 [drp] | OPHTHALMIC | Status: AC | PRN
  Administered 2018-09-21 (×3): 1 [drp] via OPHTHALMIC

## 2018-09-21 MED ORDER — ARMC OPHTHALMIC DILATING DROPS
1.0000 "application " | OPHTHALMIC | Status: AC
  Administered 2018-09-21 (×3): 1 via OPHTHALMIC

## 2018-09-21 MED ORDER — FENTANYL CITRATE (PF) 100 MCG/2ML IJ SOLN
INTRAMUSCULAR | Status: AC
  Filled 2018-09-21: qty 2

## 2018-09-21 MED ORDER — CARBACHOL 0.01 % IO SOLN
INTRAOCULAR | Status: DC | PRN
  Administered 2018-09-21: 0.5 mL via INTRAOCULAR

## 2018-09-21 MED ORDER — LIDOCAINE HCL (PF) 4 % IJ SOLN
INTRAMUSCULAR | Status: AC
  Filled 2018-09-21: qty 5

## 2018-09-21 SURGICAL SUPPLY — 16 items
GLOVE BIO SURGEON STRL SZ8 (GLOVE) ×3 IMPLANT
GLOVE BIOGEL M 6.5 STRL (GLOVE) ×3 IMPLANT
GLOVE SURG LX 8.0 MICRO (GLOVE) ×2
GLOVE SURG LX STRL 8.0 MICRO (GLOVE) ×1 IMPLANT
GOWN STRL REUS W/ TWL LRG LVL3 (GOWN DISPOSABLE) ×2 IMPLANT
GOWN STRL REUS W/TWL LRG LVL3 (GOWN DISPOSABLE) ×6
LABEL CATARACT MEDS ST (LABEL) ×3 IMPLANT
LENS IOL TECNIS ITEC 21.5 (Intraocular Lens) ×2 IMPLANT
PACK CATARACT (MISCELLANEOUS) ×3 IMPLANT
PACK CATARACT BRASINGTON LX (MISCELLANEOUS) ×3 IMPLANT
PACK EYE AFTER SURG (MISCELLANEOUS) ×3 IMPLANT
SOL BSS BAG (MISCELLANEOUS) ×3
SOLUTION BSS BAG (MISCELLANEOUS) ×1 IMPLANT
SYR 5ML LL (SYRINGE) ×3 IMPLANT
WATER STERILE IRR 250ML POUR (IV SOLUTION) ×3 IMPLANT
WIPE NON LINTING 3.25X3.25 (MISCELLANEOUS) ×3 IMPLANT

## 2018-09-21 NOTE — Anesthesia Procedure Notes (Signed)
Procedure Name: MAC Date/Time: 09/21/2018 9:49 AM Performed by: Johnna Acosta, CRNA Pre-anesthesia Checklist: Patient identified, Emergency Drugs available, Suction available, Patient being monitored and Timeout performed Patient Re-evaluated:Patient Re-evaluated prior to induction Oxygen Delivery Method: Nasal cannula Preoxygenation: Pre-oxygenation with 100% oxygen

## 2018-09-21 NOTE — Anesthesia Preprocedure Evaluation (Signed)
Anesthesia Evaluation  Patient identified by MRN, date of birth, ID band Patient awake    Reviewed: Allergy & Precautions, NPO status , Patient's Chart, lab work & pertinent test results  History of Anesthesia Complications Negative for: history of anesthetic complications  Airway Mallampati: II  TM Distance: >3 FB Neck ROM: Full    Dental  (+) Partial Lower, Partial Upper   Pulmonary neg pulmonary ROS, neg sleep apnea, neg COPD,    breath sounds clear to auscultation- rhonchi (-) wheezing      Cardiovascular hypertension, Pt. on medications (-) CAD, (-) Past MI, (-) Cardiac Stents and (-) CABG  Rhythm:Regular Rate:Normal - Systolic murmurs and - Diastolic murmurs    Neuro/Psych neg Seizures negative neurological ROS  negative psych ROS   GI/Hepatic Neg liver ROS, GERD  ,  Endo/Other  negative endocrine ROSneg diabetes  Renal/GU negative Renal ROS     Musculoskeletal negative musculoskeletal ROS (+)   Abdominal (+) - obese,   Peds  Hematology negative hematology ROS (+)   Anesthesia Other Findings Past Medical History: No date: Cancer ([facility])     Comment:  SKIN No date: DVT, lower extremity ([facility])     Comment:  H/O AFTER BACK SURGERY No date: Edema     Comment:  FEET/ANKLES No date: GERD (gastroesophageal reflux disease) No date: Hypertension No date: Vertigo   Reproductive/Obstetrics                             Anesthesia Physical Anesthesia Plan  ASA: II  Anesthesia Plan: MAC   Post-op Pain Management:    Induction: Intravenous  PONV Risk Score and Plan: 1 and Midazolam  Airway Management Planned: Natural Airway  Additional Equipment:   Intra-op Plan:   Post-operative Plan:   Informed Consent: I have reviewed the patients History and Physical, chart, labs and discussed the procedure including the risks, benefits and alternatives for the proposed anesthesia with the  patient or authorized representative who has indicated his/her understanding and acceptance.     Plan Discussed with: CRNA and Anesthesiologist  Anesthesia Plan Comments:         Anesthesia Quick Evaluation

## 2018-09-21 NOTE — Op Note (Signed)
PREOPERATIVE DIAGNOSIS:  Nuclear sclerotic cataract of the left eye.   POSTOPERATIVE DIAGNOSIS:  Nuclear sclerotic cataract of the left eye.   OPERATIVE PROCEDURE: Procedure(s): CATARACT EXTRACTION PHACO AND INTRAOCULAR LENS PLACEMENT (IOC) LEFT   SURGEON:  Birder Robson, MD.   ANESTHESIA:  Anesthesiologist: Emmie Niemann, MD CRNA: Johnna Acosta, CRNA  1.      Managed anesthesia care. 2.     0.50ml of Shugarcaine was instilled following the paracentesis   COMPLICATIONS:  None.   TECHNIQUE:   Stop and chop   DESCRIPTION OF PROCEDURE:  The patient was examined and consented in the preoperative holding area where the aforementioned topical anesthesia was applied to the left eye and then brought back to the Operating Room where the left eye was prepped and draped in the usual sterile ophthalmic fashion and a lid speculum was placed. A paracentesis was created with the side port blade and the anterior chamber was filled with viscoelastic. A near clear corneal incision was performed with the steel keratome. A continuous curvilinear capsulorrhexis was performed with a cystotome followed by the capsulorrhexis forceps. Hydrodissection and hydrodelineation were carried out with BSS on a blunt cannula. The lens was removed in a stop and chop  technique and the remaining cortical material was removed with the irrigation-aspiration handpiece. The capsular bag was inflated with viscoelastic and the Technis [id number] lens was placed in the capsular bag without complication. The remaining viscoelastic was removed from the eye with the irrigation-aspiration handpiece. The wounds were hydrated. The anterior chamber was flushed with Miostat and the eye was inflated to physiologic pressure. 0.99ml Vigamox was placed in the anterior chamber. The wounds were found to be water tight. The eye was dressed with Vigamox. The patient was given protective glasses to wear throughout the day and a shield with which to  sleep tonight. The patient was also given drops with which to begin a drop regimen today and will follow-up with me in one day. Implant Name Type Inv. Item Serial No. Manufacturer Lot No. LRB No. Used  LENS IOL DIOP 21.5 - [id number] Intraocular Lens LENS IOL DIOP 21.5 [id number] AMO  Left 1    Procedure(s) with comments: CATARACT EXTRACTION PHACO AND INTRAOCULAR LENS PLACEMENT (IOC) LEFT (Left) - [place] 00:31.6 CDE 3.71 Fluid pcak lot # [id number]  Electronically signed: Birder Robson 09/21/2018 10:05 AM

## 2018-09-21 NOTE — Transfer of Care (Signed)
Immediate Anesthesia Transfer of Care Note  Patient: Mitchell Jacobs  Procedure(s) Performed: CATARACT EXTRACTION PHACO AND INTRAOCULAR LENS PLACEMENT (IOC) LEFT (Left Eye)  Patient Location: PACU  Anesthesia Type:MAC  Level of Consciousness: awake, alert  and oriented  Airway & Oxygen Therapy: Patient Spontanous Breathing  Post-op Assessment: Report given to RN and Post -op Vital signs reviewed and stable  Post vital signs: Reviewed and stable  Last Vitals:  Vitals Value Taken Time  BP    Temp    Pulse    Resp    SpO2      Last Pain:  Vitals:   09/21/18 0840  TempSrc: Temporal  PainSc: 0-No pain         Complications: No apparent anesthesia complications

## 2018-09-21 NOTE — Anesthesia Post-op Follow-up Note (Signed)
Anesthesia QCDR form completed.        

## 2018-09-21 NOTE — Discharge Instructions (Signed)
Eye Surgery Discharge Instructions    Expect mild scratchy sensation or mild soreness. DO NOT RUB YOUR EYE!  The day of surgery:  Minimal physical activity, but bed rest is not required  No reading, computer work, or close hand work  No bending, lifting, or straining.  May watch TV  For 24 hours:  No driving, legal decisions, or alcoholic beverages  Safety precautions  Eat anything you prefer: It is better to start with liquids, then soup then solid foods.  _____ Eye patch should be worn until postoperative exam tomorrow.  ____ Solar shield eyeglasses should be worn for comfort in the sunlight/patch while sleeping  Resume all regular medications including aspirin or Coumadin if these were discontinued prior to surgery. You may shower, bathe, shave, or wash your hair. Tylenol may be taken for mild discomfort.  Call your doctor if you experience significant pain, nausea, or vomiting, fever > 101 or other signs of infection. [phone] or [phone] Specific instructions:  Follow-up Information    Birder Robson, MD Follow up on 09/22/2018.   Specialty:  Ophthalmology Why:  10:50 Contact information: [address] [place] [address] [phone]

## 2018-09-21 NOTE — H&P (Signed)
All labs reviewed. Abnormal studies sent to patients PCP when indicated.  Previous H&P reviewed, patient examined, there are NO CHANGES.  Mitchell Bills Porfilio1/7/20209:41 AM

## 2018-09-22 ENCOUNTER — Encounter: Payer: Self-pay | Admitting: Ophthalmology

## 2018-09-22 NOTE — Anesthesia Postprocedure Evaluation (Signed)
Anesthesia Post Note  Patient: Mitchell Jacobs  Procedure(s) Performed: CATARACT EXTRACTION PHACO AND INTRAOCULAR LENS PLACEMENT (IOC) LEFT (Left Eye)  Patient location during evaluation: PACU Anesthesia Type: MAC Level of consciousness: awake and alert Pain management: pain level controlled Vital Signs Assessment: post-procedure vital signs reviewed and stable Respiratory status: spontaneous breathing, nonlabored ventilation, respiratory function stable and patient connected to nasal cannula oxygen Cardiovascular status: stable and blood pressure returned to baseline Postop Assessment: no apparent nausea or vomiting Anesthetic complications: no     Last Vitals:  Vitals:   09/21/18 1005 09/21/18 1038  BP: 138/60 (!) 149/69  Pulse: (!) 59 (!) 58  Resp:  16  Temp: 36.6 C   SpO2: 100% 100%    Last Pain:  Vitals:   09/21/18 1038  TempSrc:   PainSc: 0-No pain                 Roni Friberg

## 2024-02-14 DEATH — deceased
